# Patient Record
Sex: Male | Born: 2005 | Race: White | Hispanic: Yes | Marital: Single | Smoking: Never smoker
Health system: Southern US, Community
[De-identification: ages and names within clinical notes are randomized; demographics above are authoritative.]

## PROBLEM LIST (undated history)

## (undated) DIAGNOSIS — L723 Sebaceous cyst: Secondary | ICD-10-CM

## (undated) DIAGNOSIS — J45909 Unspecified asthma, uncomplicated: Secondary | ICD-10-CM

## (undated) DIAGNOSIS — Z8709 Personal history of other diseases of the respiratory system: Secondary | ICD-10-CM

## (undated) DIAGNOSIS — Z98811 Dental restoration status: Secondary | ICD-10-CM

---
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
## 2024-08-15 ENCOUNTER — Ambulatory Visit: Admitting: Pediatrics
